# Patient Record
Sex: Female | Born: 1975 | Race: White | Hispanic: No
Health system: Southern US, Community
[De-identification: ages and names within clinical notes are randomized; demographics above are authoritative.]

---

## 2014-06-23 ENCOUNTER — Other Ambulatory Visit: Payer: Self-pay | Admitting: Family Medicine

## 2014-06-23 DIAGNOSIS — Z1231 Encounter for screening mammogram for malignant neoplasm of breast: Secondary | ICD-10-CM

## 2014-07-05 ENCOUNTER — Ambulatory Visit
Admission: RE | Admit: 2014-07-05 | Discharge: 2014-07-05 | Disposition: A | Discharge status: Home / Self Care | Payer: Medicaid Other | Source: Ambulatory Visit | Attending: Family Medicine | Admitting: Family Medicine

## 2014-07-05 DIAGNOSIS — Z1231 Encounter for screening mammogram for malignant neoplasm of breast: Secondary | ICD-10-CM

## 2014-07-06 ENCOUNTER — Other Ambulatory Visit: Payer: Self-pay | Admitting: Family Medicine

## 2014-07-06 DIAGNOSIS — R928 Other abnormal and inconclusive findings on diagnostic imaging of breast: Secondary | ICD-10-CM

## 2014-07-14 ENCOUNTER — Ambulatory Visit
Admission: RE | Admit: 2014-07-14 | Discharge: 2014-07-14 | Disposition: A | Discharge status: Home / Self Care | Payer: Medicaid Other | Source: Ambulatory Visit | Attending: Family Medicine | Admitting: Family Medicine

## 2014-07-14 DIAGNOSIS — R928 Other abnormal and inconclusive findings on diagnostic imaging of breast: Secondary | ICD-10-CM

## 2015-02-02 ENCOUNTER — Other Ambulatory Visit: Payer: Self-pay | Admitting: Family Medicine

## 2015-02-02 DIAGNOSIS — R920 Mammographic microcalcification found on diagnostic imaging of breast: Secondary | ICD-10-CM

## 2015-02-02 DIAGNOSIS — Z803 Family history of malignant neoplasm of breast: Secondary | ICD-10-CM

## 2015-02-09 ENCOUNTER — Ambulatory Visit
Admission: RE | Admit: 2015-02-09 | Discharge: 2015-02-09 | Disposition: A | Discharge status: Home / Self Care | Payer: 59 | Source: Ambulatory Visit | Attending: Family Medicine | Admitting: Family Medicine

## 2015-02-09 DIAGNOSIS — Z803 Family history of malignant neoplasm of breast: Secondary | ICD-10-CM

## 2015-02-09 DIAGNOSIS — R920 Mammographic microcalcification found on diagnostic imaging of breast: Secondary | ICD-10-CM

## 2016-03-28 ENCOUNTER — Other Ambulatory Visit: Payer: Self-pay | Admitting: Family Medicine

## 2016-03-28 DIAGNOSIS — R921 Mammographic calcification found on diagnostic imaging of breast: Secondary | ICD-10-CM

## 2016-04-03 ENCOUNTER — Ambulatory Visit
Admission: RE | Admit: 2016-04-03 | Discharge: 2016-04-03 | Disposition: A | Discharge status: Home / Self Care | Payer: 59 | Source: Ambulatory Visit | Attending: Family Medicine | Admitting: Family Medicine

## 2016-04-03 ENCOUNTER — Other Ambulatory Visit: Payer: Self-pay | Admitting: Family Medicine

## 2016-04-03 DIAGNOSIS — R921 Mammographic calcification found on diagnostic imaging of breast: Secondary | ICD-10-CM

## 2016-04-03 DIAGNOSIS — N6310 Unspecified lump in the right breast, unspecified quadrant: Secondary | ICD-10-CM

## 2016-04-03 DIAGNOSIS — R928 Other abnormal and inconclusive findings on diagnostic imaging of breast: Secondary | ICD-10-CM

## 2016-04-08 ENCOUNTER — Other Ambulatory Visit: Payer: Self-pay | Admitting: Family Medicine

## 2016-04-08 DIAGNOSIS — N6310 Unspecified lump in the right breast, unspecified quadrant: Secondary | ICD-10-CM

## 2016-04-09 ENCOUNTER — Other Ambulatory Visit: Payer: Self-pay | Admitting: Family Medicine

## 2016-04-09 ENCOUNTER — Ambulatory Visit
Admission: RE | Admit: 2016-04-09 | Discharge: 2016-04-09 | Disposition: A | Discharge status: Home / Self Care | Payer: 59 | Source: Ambulatory Visit | Attending: Family Medicine | Admitting: Family Medicine

## 2016-04-09 DIAGNOSIS — N6001 Solitary cyst of right breast: Secondary | ICD-10-CM

## 2016-04-09 DIAGNOSIS — N6310 Unspecified lump in the right breast, unspecified quadrant: Secondary | ICD-10-CM

## 2018-02-26 ENCOUNTER — Other Ambulatory Visit: Payer: Self-pay | Admitting: Family Medicine

## 2018-02-26 DIAGNOSIS — N6001 Solitary cyst of right breast: Secondary | ICD-10-CM

## 2018-03-04 ENCOUNTER — Ambulatory Visit
Admission: RE | Admit: 2018-03-04 | Discharge: 2018-03-04 | Disposition: A | Discharge status: Home / Self Care | Payer: 59 | Source: Ambulatory Visit | Attending: Family Medicine | Admitting: Family Medicine

## 2018-03-04 ENCOUNTER — Ambulatory Visit: Admission: RE | Admit: 2018-03-04 | Payer: 59 | Source: Ambulatory Visit

## 2018-03-04 DIAGNOSIS — N6001 Solitary cyst of right breast: Secondary | ICD-10-CM

## 2019-05-25 ENCOUNTER — Other Ambulatory Visit: Payer: Self-pay | Admitting: Family Medicine

## 2019-05-25 DIAGNOSIS — Z1231 Encounter for screening mammogram for malignant neoplasm of breast: Secondary | ICD-10-CM

## 2019-07-09 ENCOUNTER — Other Ambulatory Visit: Payer: Self-pay

## 2019-07-09 ENCOUNTER — Ambulatory Visit
Admission: RE | Admit: 2019-07-09 | Discharge: 2019-07-09 | Disposition: A | Discharge status: Home / Self Care | Payer: 59 | Source: Ambulatory Visit | Attending: Family Medicine | Admitting: Family Medicine

## 2019-07-09 DIAGNOSIS — Z1231 Encounter for screening mammogram for malignant neoplasm of breast: Secondary | ICD-10-CM

## 2019-07-13 ENCOUNTER — Other Ambulatory Visit: Payer: Self-pay | Admitting: Family Medicine

## 2019-07-13 DIAGNOSIS — R928 Other abnormal and inconclusive findings on diagnostic imaging of breast: Secondary | ICD-10-CM

## 2019-07-14 ENCOUNTER — Ambulatory Visit
Admission: RE | Admit: 2019-07-14 | Discharge: 2019-07-14 | Disposition: A | Discharge status: Home / Self Care | Payer: 59 | Source: Ambulatory Visit | Attending: Family Medicine | Admitting: Family Medicine

## 2019-07-14 ENCOUNTER — Other Ambulatory Visit: Payer: Self-pay

## 2019-07-14 DIAGNOSIS — R928 Other abnormal and inconclusive findings on diagnostic imaging of breast: Secondary | ICD-10-CM

## 2020-04-18 ENCOUNTER — Encounter (INDEPENDENT_AMBULATORY_CARE_PROVIDER_SITE_OTHER): Payer: Self-pay

## 2020-08-14 ENCOUNTER — Other Ambulatory Visit: Payer: Self-pay | Admitting: Family Medicine

## 2020-08-14 DIAGNOSIS — Z1231 Encounter for screening mammogram for malignant neoplasm of breast: Secondary | ICD-10-CM

## 2020-08-25 ENCOUNTER — Ambulatory Visit
Admission: RE | Admit: 2020-08-25 | Discharge: 2020-08-25 | Disposition: A | Discharge status: Home / Self Care | Payer: 59 | Source: Ambulatory Visit | Attending: Family Medicine | Admitting: Family Medicine

## 2020-08-25 ENCOUNTER — Other Ambulatory Visit: Payer: Self-pay

## 2020-08-25 DIAGNOSIS — Z1231 Encounter for screening mammogram for malignant neoplasm of breast: Secondary | ICD-10-CM

## 2021-09-17 ENCOUNTER — Other Ambulatory Visit: Payer: Self-pay | Admitting: Family Medicine

## 2021-09-17 DIAGNOSIS — Z1231 Encounter for screening mammogram for malignant neoplasm of breast: Secondary | ICD-10-CM

## 2021-10-17 ENCOUNTER — Ambulatory Visit
Admission: RE | Admit: 2021-10-17 | Discharge: 2021-10-17 | Disposition: A | Discharge status: Home / Self Care | Payer: 59 | Source: Ambulatory Visit | Attending: Family Medicine | Admitting: Family Medicine

## 2021-10-17 ENCOUNTER — Other Ambulatory Visit: Payer: Self-pay

## 2021-10-17 DIAGNOSIS — Z1231 Encounter for screening mammogram for malignant neoplasm of breast: Secondary | ICD-10-CM

## 2022-02-12 IMAGING — MG MM DIGITAL SCREENING BILAT W/ TOMO AND CAD
8 series · 9 of 24 positions shown · non-contrast
Comparison: Previous exam(s).

CLINICAL DATA: Screening.

EXAM:
DIGITAL SCREENING BILATERAL MAMMOGRAM WITH TOMOSYNTHESIS AND CAD
TECHNIQUE: Bilateral screening digital craniocaudal and mediolateral oblique
mammograms were obtained. Bilateral screening digital breast
tomosynthesis was performed. The images were evaluated with
computer-aided detection.

[L CC synth-2D]
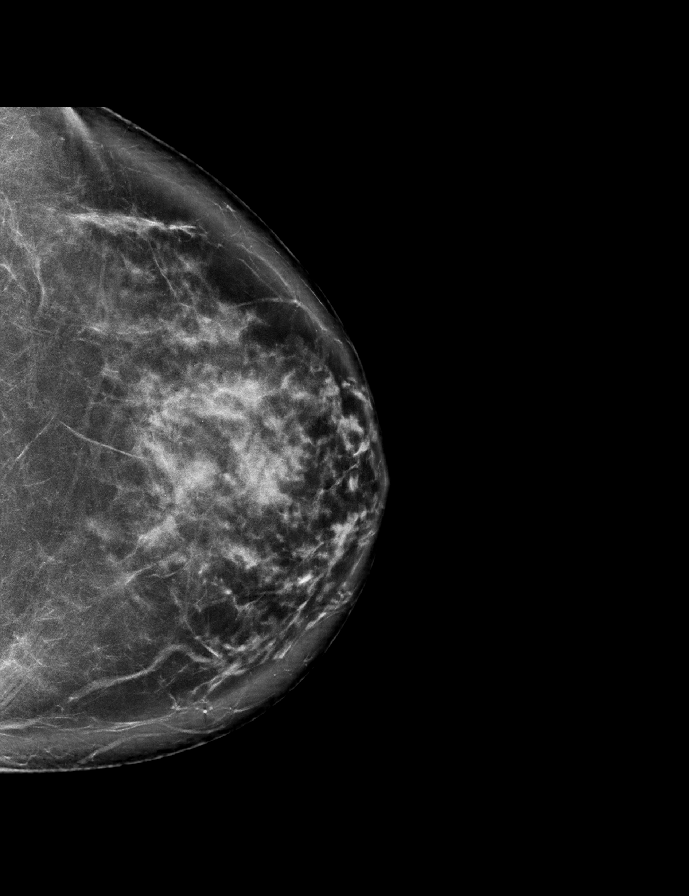

[R MLO synth-2D]
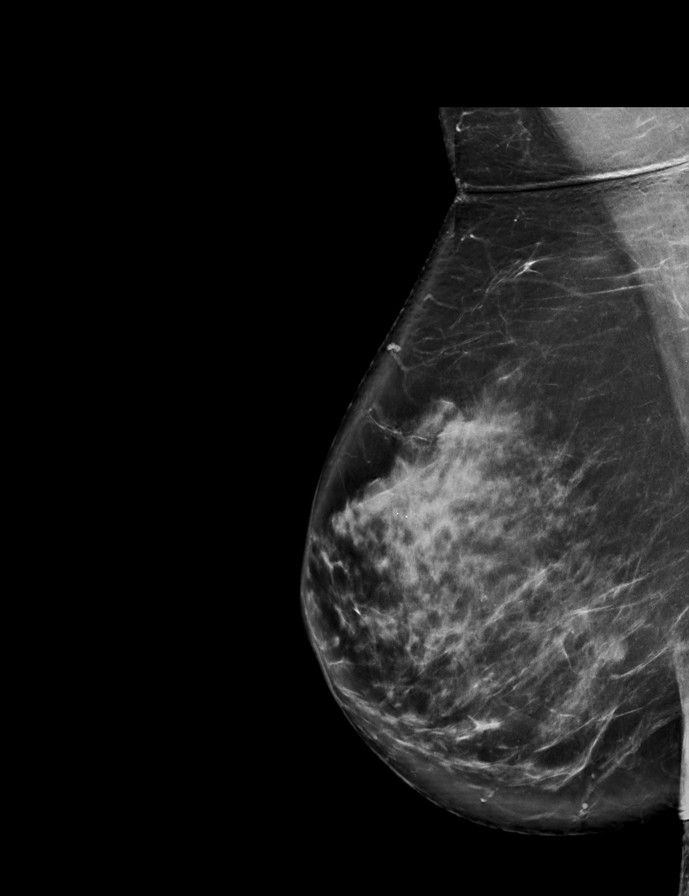

[R CC synth-2D]
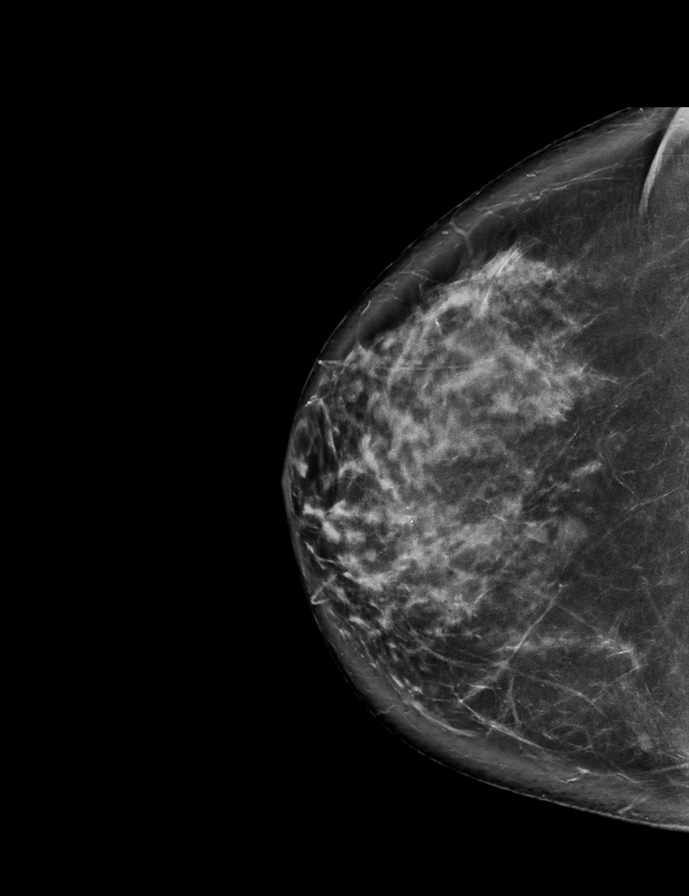

[L MLO synth-2D]
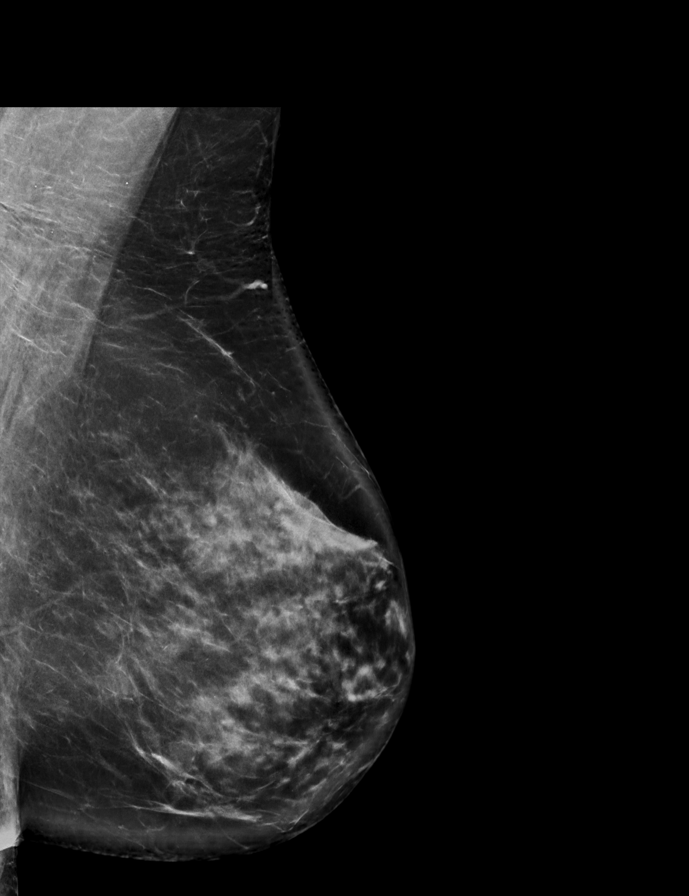

[L MLO tomo · 2 of 92 frames shown]
[frame 30/92]
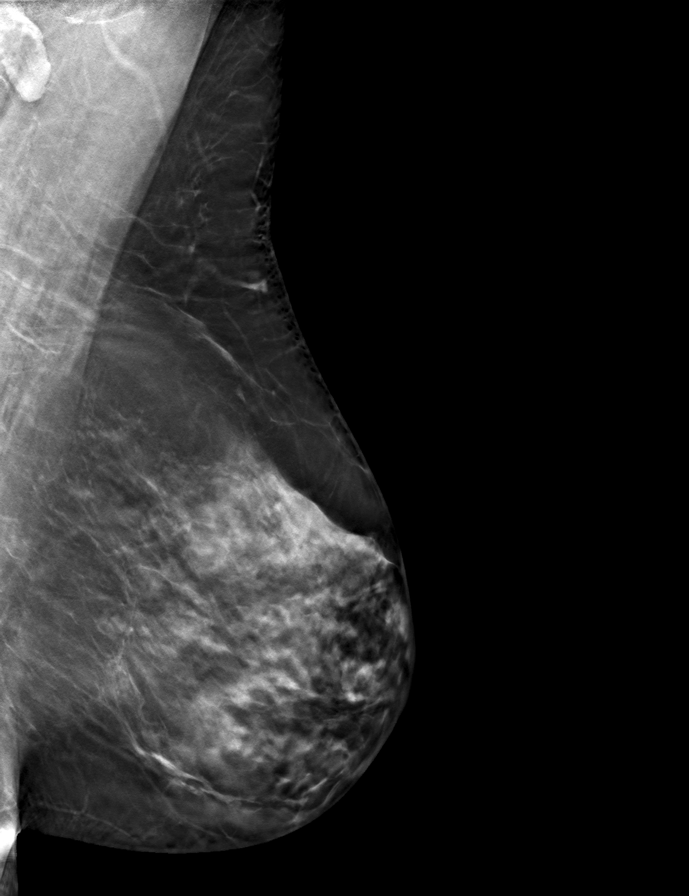
[frame 47/92]
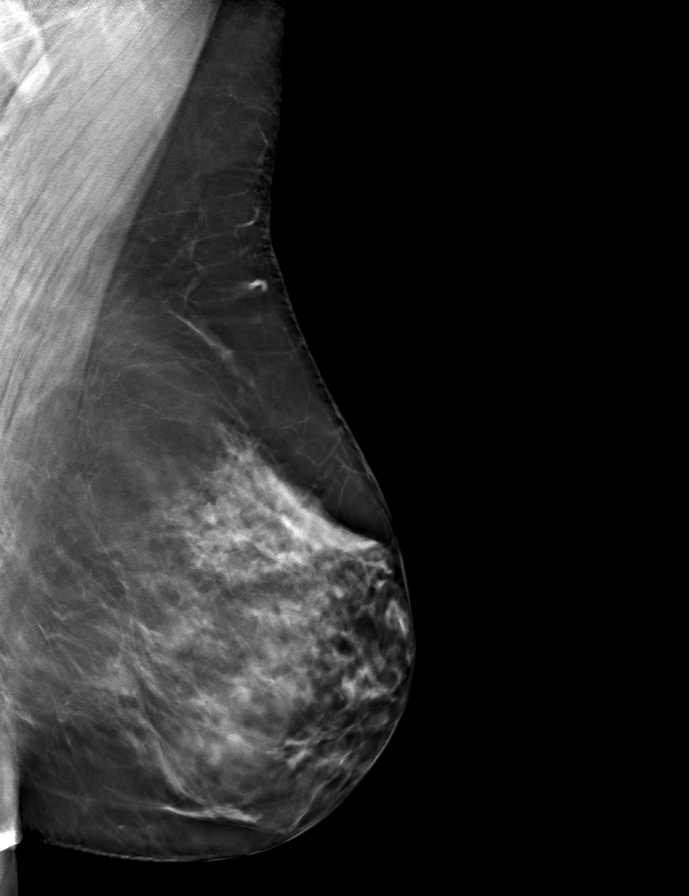

[R CC tomo · tomo slice 41/81.0]
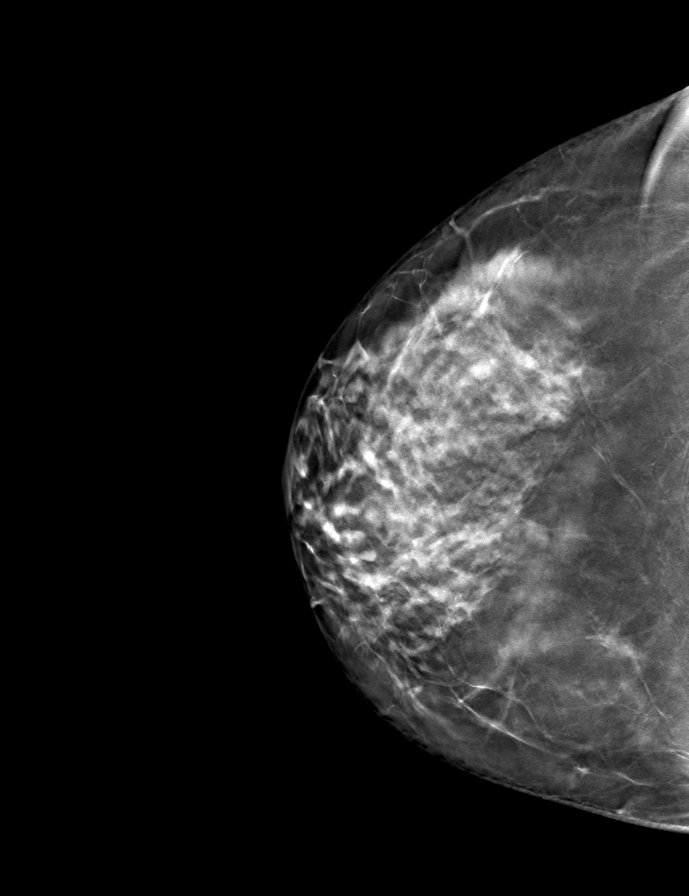

[L CC tomo · tomo slice 45/88.0]
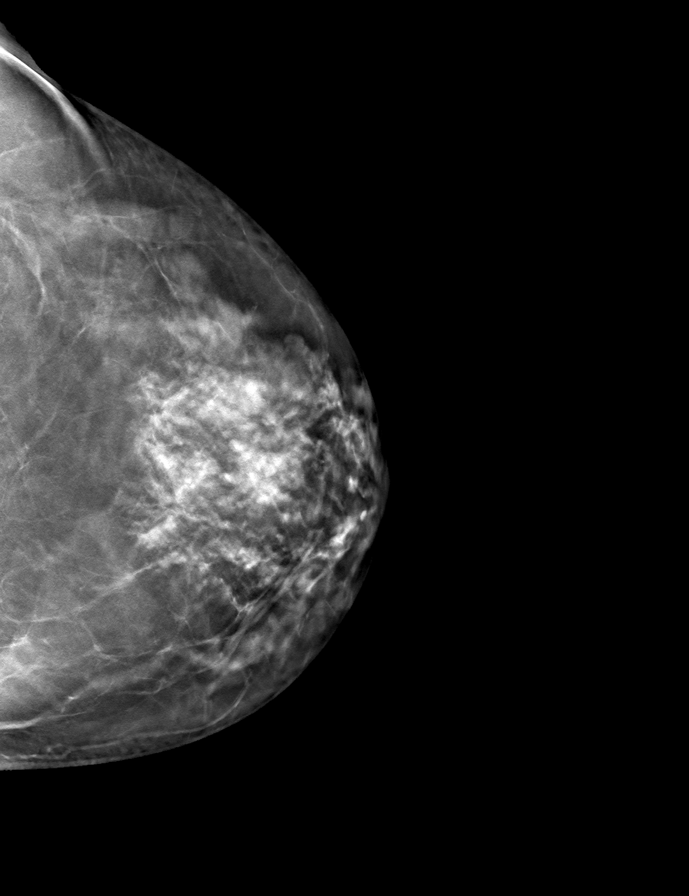

[R MLO tomo · tomo slice 47/94.0]
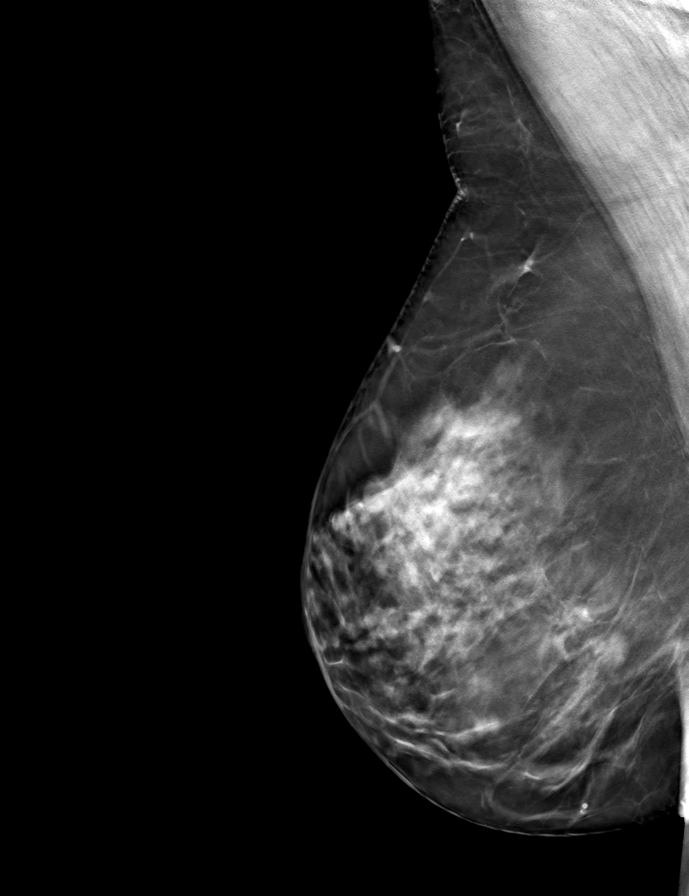

[9 of 24 positions shown; findings below may reference images not displayed]

ACR Breast Density Category c: The breast tissue is heterogeneously
dense, which may obscure small masses.
FINDINGS: There are no findings suspicious for malignancy.
IMPRESSION: No mammographic evidence of malignancy. A result letter of this
screening mammogram will be mailed directly to the patient.

RECOMMENDATION:
Screening mammogram in one year. (Code:Q3-W-BC3)

BI-RADS CATEGORY  1: Negative.

## 2023-01-14 ENCOUNTER — Other Ambulatory Visit: Payer: Self-pay | Admitting: Family Medicine

## 2023-01-14 DIAGNOSIS — Z1231 Encounter for screening mammogram for malignant neoplasm of breast: Secondary | ICD-10-CM

## 2023-03-05 ENCOUNTER — Ambulatory Visit
Admission: RE | Admit: 2023-03-05 | Discharge: 2023-03-05 | Disposition: A | Discharge status: Home / Self Care | Payer: 59 | Source: Ambulatory Visit | Attending: Family Medicine | Admitting: Family Medicine

## 2023-03-05 DIAGNOSIS — Z1231 Encounter for screening mammogram for malignant neoplasm of breast: Secondary | ICD-10-CM

## 2024-02-06 ENCOUNTER — Other Ambulatory Visit: Payer: Self-pay | Admitting: Family Medicine

## 2024-02-06 DIAGNOSIS — Z1231 Encounter for screening mammogram for malignant neoplasm of breast: Secondary | ICD-10-CM

## 2024-03-10 ENCOUNTER — Ambulatory Visit
Admission: RE | Admit: 2024-03-10 | Discharge: 2024-03-10 | Disposition: A | Discharge status: Home / Self Care | Payer: 59 | Source: Ambulatory Visit | Attending: Family Medicine | Admitting: Family Medicine

## 2024-03-10 DIAGNOSIS — Z1231 Encounter for screening mammogram for malignant neoplasm of breast: Secondary | ICD-10-CM
